# Patient Record
Sex: Male | Born: 2000 | Race: Black or African American | Hispanic: No | Marital: Single | Smoking: Never smoker
Health system: Southern US, Community
[De-identification: ages and names within clinical notes are randomized; demographics above are authoritative.]

## PROBLEM LIST (undated history)

## (undated) DIAGNOSIS — H539 Unspecified visual disturbance: Secondary | ICD-10-CM

## (undated) DIAGNOSIS — R51 Headache: Secondary | ICD-10-CM

## (undated) DIAGNOSIS — R519 Headache, unspecified: Secondary | ICD-10-CM

## (undated) DIAGNOSIS — M9511 Cauliflower ear, right ear: Secondary | ICD-10-CM

## (undated) DIAGNOSIS — T7840XA Allergy, unspecified, initial encounter: Secondary | ICD-10-CM

## (undated) HISTORY — PX: CIRCUMCISION: SUR203

---
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
## 2019-10-23 ENCOUNTER — Ambulatory Visit: Payer: BC Managed Care – PPO | Attending: Orthopedic Surgery | Admitting: Physical Therapy

## 2019-10-23 ENCOUNTER — Encounter: Payer: Self-pay | Admitting: Physical Therapy

## 2019-10-23 ENCOUNTER — Other Ambulatory Visit: Payer: Self-pay

## 2019-10-23 DIAGNOSIS — M25561 Pain in right knee: Secondary | ICD-10-CM | POA: Insufficient documentation

## 2019-10-23 DIAGNOSIS — R600 Localized edema: Secondary | ICD-10-CM | POA: Diagnosis present

## 2019-10-23 DIAGNOSIS — M25661 Stiffness of right knee, not elsewhere classified: Secondary | ICD-10-CM | POA: Diagnosis present

## 2019-10-23 DIAGNOSIS — R2689 Other abnormalities of gait and mobility: Secondary | ICD-10-CM | POA: Insufficient documentation

## 2019-10-23 NOTE — Addendum Note (Signed)
Addended by: Dessie Coma on: 10/23/2019 02:57 PM   Modules accepted: Orders

## 2019-10-23 NOTE — Therapy (Signed)
[facility] [address] [place], [place], [address] Phone: [phone]   Fax:  [phone]  Physical Therapy Evaluation  Patient Details  Name: Miguel Austin MRN: [id number] Date of Birth: 07-11-2001 Referring Provider (PT): Dr Midge Aver    Encounter Date: 10/23/2019  PT End of Session - 10/23/19 1244    Visit Number  1    Number of Visits  8    Date for PT Re-Evaluation  11/20/19    Authorization Type  BCBS per patient    PT Start Time  1015    PT Stop Time  1056    PT Time Calculation (min)  41 min    Activity Tolerance  Patient tolerated treatment well    Behavior During Therapy  [facility] for tasks assessed/performed       Past Medical History:  Diagnosis Date  . Allergy   . Cauliflower ear, right   . Headache   . Vision abnormalities    wears glasses    Past Surgical History:  Procedure Laterality Date  . CIRCUMCISION    . HEMATOMA EVACUATION Right 08/21/2018   Procedure: EVACUATION HEMATOMA;  Surgeon: Suzanna Obey, MD;  Location: [facility] OR;  Service: ENT;  Laterality: Right;    There were no vitals filed for this visit.   Subjective Assessment - 10/23/19 1021    Subjective  Patient had a right menisectomy on 10/09/2019 He was on crutches for 2 weeks and just came off yesterday. His knee is uncomforabtle but not significantly painful. He is a Proofreader.    How long can you walk comfortably?  becaomes tight after some time    Diagnostic tests  Nothing post-op    Patient Stated Goals  to return to wreslitng    Currently in Pain?  Yes    Pain Score  4     Pain Location  Knee    Pain Orientation  Right    Pain Descriptors / Indicators  Aching    Pain Type  Acute pain;Surgical pain    Pain Frequency  Constant    Aggravating Factors   bending the knee    Pain Relieving Factors  rest    Effect of Pain on Daily Activities  difficulty perfroming ADL's         [facility] PT Assessment - 10/23/19 0001      Assessment   Medical Diagnosis  Right Knee Menisectomy     Referring Provider (PT)  Dr Worthy Flank Spang     Hand Dominance  Right    Next MD Visit  Nothing Scheduled     Prior Therapy  None      Precautions   Precautions  None      Restrictions   Weight Bearing Restrictions  No      Balance Screen   Has the patient fallen in the past 6 months  No    Has the patient had a decrease in activity level because of a fear of falling?   No    Is the patient reluctant to leave their home because of a fear of falling?   No      Home Environment   Additional Comments  3rd floor dorm room s       Prior Function   Level of Independence  Independent    Vocation  Student    Leisure  wrestler       Cognition   Overall Cognitive Status  Within Functional Limits for tasks assessed  Attention  Focused    Focused Attention  Appears intact    Memory  Appears intact    Awareness  Appears intact    Problem Solving  Appears intact      Observation/Other Assessments   Observations  well healed arthrocopic portals     Focus on Therapeutic Outcomes (FOTO)   only seeing for the beggning of his treatment       Sensation   Light Touch  Appears Intact    Additional Comments  denies parathesias       Coordination   Gross Motor Movements are Fluid and Coordinated  Yes    Fine Motor Movements are Fluid and Coordinated  Yes      ROM / Strength   AROM / PROM / Strength  AROM;PROM;Strength      AROM   Overall AROM Comments  100 degrees of knee flexion; full extension but feels pressue       PROM   PROM Assessment Site  Knee    Right/Left Knee  Left;Right    Right Knee Flexion  110    Left Knee Flexion  135      Strength   Strength Assessment Site  Hip;Knee    Right/Left Hip  Left;Right    Right Hip Flexion  4+/5    Right Hip ABduction  5/5    Right Hip ADduction  5/5    Left Hip Flexion  5/5    Left Hip Extension  5/5    Left Hip ABduction  5/5    Left Hip ADduction  5/5    Right/Left  Knee  Left;Right    Right Knee Flexion  5/5    Right Knee Extension  4+/5    Left Knee Flexion  5/5    Left Knee Extension  5/5      Palpation   Palpation comment  no unexpected tenderness to palpation       Ambulation/Gait   Gait Comments  lateral movement off the right leg. Decreased single leg stance time.                 Objective measurements completed on examination: See above findings.      [facility] Adult PT Treatment/Exercise - 10/23/19 0001      Knee/Hip Exercises: Stretches   Active Hamstring Stretch Limitations  hamstring stretch with strap 3x20 sec hold     Other Knee/Hip Stretches  supine heel slide in pain free range 5x10 sec hold       Knee/Hip Exercises: Standing   Other Standing Knee Exercises  standing slow march x15       Knee/Hip Exercises: Supine   Straight Leg Raises Limitations  x20       Knee/Hip Exercises: Sidelying   Other Sidelying Knee/Hip Exercises  x20                PT Short Term Goals - 10/23/19 1250      PT SHORT TERM GOAL #1   Title  Patient will deonstrate full pain free flexion    Time  3    Period  Weeks    Status  New    Target Date  11/13/19      PT SHORT TERM GOAL #2   Title  Patient will demonstrate full pain free extension    Time  3    Period  Weeks    Status  New    Target Date  11/13/19      PT  SHORT TERM GOAL #3   Title  Patient will demonstrate 5/5 gross left LE strength    Time  3    Period  Weeks    Status  New    Target Date  11/13/19                Plan - 10/23/19 1244    Clinical Impression Statement  Patient is a 19 year old male S/P right medial meniscectomy on 10/09/2019. He presents with expected limitations in flexio, strength, and gait. He has minor swelling at this time. He will be seen for 2 weeks at our [facility] clinic before returning to school to finish his rehab. He is a TEFL teacher.    Personal Factors and Comorbidities  Other   college wrestler    Examination-Activity Limitations  Locomotion Level;Stairs;Squat    Examination-Participation Restrictions  Community Activity    Stability/Clinical Decision Making  Stable/Uncomplicated    Clinical Decision Making  Low    Rehab Potential  Excellent    PT Frequency  2x / week    PT Duration  4 weeks    PT Treatment/Interventions  ADLs/Self Care Home Management;Cryotherapy;Electrical Stimulation;Iontophoresis 4mg /ml Dexamethasone;Traction;Moist Heat;DME Instruction;Gait training;Stair training;Functional mobility training;Therapeutic activities;Therapeutic exercise;Neuromuscular re-education;Patient/family education;Passive range of motion;Taping    PT Next Visit Plan  start closed chain standing exercises if tolerated for ghome program. Abvid deep squatting; start single leg stance if patient has the quad control; review mat exercsies.Begin sep ups; work on ROM    PT Home Exercise Plan  SLR forward and sidelying;    Consulted and Agree with Plan of Care  Patient       Patient will benefit from skilled therapeutic intervention in order to improve the following deficits and impairments:  Abnormal gait, Decreased endurance, Difficulty walking, Pain, Decreased activity tolerance, Decreased range of motion  Visit Diagnosis: Acute pain of right knee  Stiffness of right knee, not elsewhere classified  Other abnormalities of gait and mobility  Localized edema     Problem List There are no problems to display for this patient.   Carney Living PT DPT  10/23/2019, 2:52 PM  [facility] [place] [facility] [address] [place], [place], [address] Phone: [phone]   Fax:  [phone]  Name: Miguel Austin MRN: [id number] Date of Birth: 2001-02-08

## 2019-10-25 ENCOUNTER — Ambulatory Visit: Payer: BC Managed Care – PPO | Admitting: Physical Therapy

## 2019-10-25 ENCOUNTER — Other Ambulatory Visit: Payer: Self-pay

## 2019-10-25 DIAGNOSIS — R2689 Other abnormalities of gait and mobility: Secondary | ICD-10-CM

## 2019-10-25 DIAGNOSIS — R600 Localized edema: Secondary | ICD-10-CM

## 2019-10-25 DIAGNOSIS — M25561 Pain in right knee: Secondary | ICD-10-CM

## 2019-10-25 DIAGNOSIS — M25661 Stiffness of right knee, not elsewhere classified: Secondary | ICD-10-CM

## 2019-10-26 NOTE — Therapy (Addendum)
[place], [place], [address] Phone: [phone]   Fax:  [phone]  Physical Therapy Treatment  Patient Details  Name: Miguel Austin MRN: [id number] Date of Birth: 03-04-2001 Referring Provider (PT): Dr Pincus Badder    Encounter Date: 10/25/2019  PT End of Session - 10/25/19 1641    Visit Number  2    Number of Visits  8    Date for PT Re-Evaluation  11/20/19    Authorization Type  BCBS per patient    PT Start Time  1610    PT Stop Time  1648    PT Time Calculation (min)  38 min    Activity Tolerance  Patient tolerated treatment well    Behavior During Therapy  [facility] for tasks assessed/performed       Past Medical History:  Diagnosis Date  . Allergy   . Cauliflower ear, right   . Headache   . Vision abnormalities    wears glasses    Past Surgical History:  Procedure Laterality Date  . CIRCUMCISION    . HEMATOMA EVACUATION Right 08/21/2018   Procedure: EVACUATION HEMATOMA;  Surgeon: Melissa Montane, MD;  Location: [place];  Service: ENT;  Laterality: Right;    There were no vitals filed for this visit.  Subjective Assessment - 10/26/19 1036    Subjective  Patient reports no pain. he feels the swelling has gone down    Currently in Pain?  No/denies                       [facility] Adult PT Treatment/Exercise - 10/26/19 0001      Lumbar Exercises: Machines for Strengthening   Other Lumbar Machine Exercise  leg press x20 60 lbs mild pressure reported       Knee/Hip Exercises: Stretches   Active Hamstring Stretch Limitations  hamstring stretch with strap 3x20 sec hold       Knee/Hip Exercises: Standing   Heel Raises Limitations  x20    Knee Flexion Limitations  Standing slow march x20 each leg     Functional Squat Limitations  x20 0-60     SLS  3x30 sec hold     Other Standing Knee Exercises  step onto air-ex and hold x20 forward and lateral     Other Standing Knee Exercises   lateral band walk blue x20       Knee/Hip Exercises: Supine   Short Arc Quad Sets Limitations  x30 4lb     Bridges Limitations  x30     Straight Leg Raises Limitations  x30 2lb       Knee/Hip Exercises: Sidelying   Other Sidelying Knee/Hip Exercises  x30 2lb       Knee/Hip Exercises: Prone   Other Prone Exercises  x30 2lb               PT Short Term Goals - 10/23/19 1250      PT SHORT TERM GOAL #1   Title  Patient will deonstrate full pain free flexion    Time  3    Period  Weeks    Status  New    Target Date  11/13/19      PT SHORT TERM GOAL #2   Title  Patient will demonstrate full pain free extension    Time  3    Period  Weeks    Status  New    Target Date  11/13/19  PT SHORT TERM GOAL #3   Title  Patient will demonstrate 5/5 gross left LE strength    Time  3    Period  Weeks    Status  New    Target Date  11/13/19               Plan - 10/26/19 1040    Clinical Impression Statement  Patient tolerated treatment well. He reported a minor feeling of swelling of treatment. he was advised to ice at home. We will either progress or scale back treatment depending on reaction to tratment.    Personal Factors and Comorbidities  Other    Examination-Activity Limitations  Locomotion Level;Stairs;Squat    Examination-Participation Restrictions  Community Activity    Stability/Clinical Decision Making  Stable/Uncomplicated    Clinical Decision Making  Low    Rehab Potential  Excellent    PT Frequency  2x / week    PT Duration  4 weeks    PT Treatment/Interventions  ADLs/Self Care Home Management;Cryotherapy;Electrical Stimulation;Iontophoresis 4mg /ml Dexamethasone;Traction;Moist Heat;DME Instruction;Gait training;Stair training;Functional mobility training;Therapeutic activities;Therapeutic exercise;Neuromuscular re-education;Patient/family education;Passive range of motion;Taping    PT Next Visit Plan  consider rebounder, step ups, cone drill and cable  walk if last week tolerated well    PT Home Exercise Plan  SLR forward and sidelying;    Consulted and Agree with Plan of Care  Patient       Patient will benefit from skilled therapeutic intervention in order to improve the following deficits and impairments:  Abnormal gait, Decreased endurance, Difficulty walking, Pain, Decreased activity tolerance, Decreased range of motion  Visit Diagnosis: Acute pain of right knee  Stiffness of right knee, not elsewhere classified  Other abnormalities of gait and mobility  Localized edema     Problem List There are no problems to display for this patient.   PT DPT  10/26/2019, 10:45 AM  [facility] [address] [place], [place], [place] Phone: [phone]   Fax:  [phone]  Name: Miguel Austin MRN: Donnella Sham Date of Birth: April 15, 2001

## 2019-11-01 ENCOUNTER — Encounter: Payer: Self-pay | Admitting: Physical Therapy

## 2019-11-01 ENCOUNTER — Other Ambulatory Visit: Payer: Self-pay

## 2019-11-01 ENCOUNTER — Ambulatory Visit: Payer: BC Managed Care – PPO | Admitting: Physical Therapy

## 2019-11-01 DIAGNOSIS — R600 Localized edema: Secondary | ICD-10-CM

## 2019-11-01 DIAGNOSIS — M25561 Pain in right knee: Secondary | ICD-10-CM

## 2019-11-01 DIAGNOSIS — M25661 Stiffness of right knee, not elsewhere classified: Secondary | ICD-10-CM

## 2019-11-01 DIAGNOSIS — R2689 Other abnormalities of gait and mobility: Secondary | ICD-10-CM

## 2019-11-01 NOTE — Therapy (Signed)
[place], [place], [address] Phone: [phone]   Fax:  [phone]  Physical Therapy Treatment  Patient Details  Name: Miguel Austin MRN: [id number] Date of Birth: 10/16/2001 Referring Provider (PT): Dr Pincus Badder    Encounter Date: 11/01/2019  PT End of Session - 11/01/19 0847    Visit Number  3    Number of Visits  8    Date for PT Re-Evaluation  11/20/19    Authorization Type  BCBS per patient    PT Start Time  0845    PT Stop Time  0923    PT Time Calculation (min)  38 min       Past Medical History:  Diagnosis Date  . Allergy   . Cauliflower ear, right   . Headache   . Vision abnormalities    wears glasses    Past Surgical History:  Procedure Laterality Date  . CIRCUMCISION    . HEMATOMA EVACUATION Right 08/21/2018   Procedure: EVACUATION HEMATOMA;  Surgeon: Melissa Montane, MD;  Location: [place];  Service: ENT;  Laterality: Right;    There were no vitals filed for this visit.                    [facility] Adult PT Treatment/Exercise - 11/01/19 0001      Knee/Hip Exercises: Stretches   Active Hamstring Stretch Limitations  hamstring stretch with strap 3x20 sec hold     Gastroc Stretch Limitations  slant board stretch     Other Knee/Hip Stretches  supine heel slide in pain free range 5x10 sec hold    130 degrees      Knee/Hip Exercises: Aerobic   Recumbent Bike  L2 x 5 minutes       Knee/Hip Exercises: Standing   Heel Raises Limitations  x20    Knee Flexion Limitations  Standing slow march x20 each leg    on air ex    Lateral Step Up  20 reps;Hand Hold: 0;Step Height: 6"    Forward Step Up  20 reps;Step Height: 6"    Functional Squat Limitations  x20 0-60    x 20    SLS  3x30 sec hold    on air ex    Other Standing Knee Exercises  cone drill from counter, then from 6 inch step, cues for alignment  and postur e      Knee/Hip Exercises: Supine   Short Arc Quad  Sets Limitations  x 30 4lbs    Bridges with Clamshell  3 sets;10 reps   blue band    Straight Leg Raises Limitations  x30 2lb       Knee/Hip Exercises: Sidelying   Other Sidelying Knee/Hip Exercises  x30 2lb       Knee/Hip Exercises: Prone   Other Prone Exercises  x30 2lb               PT Short Term Goals - 10/23/19 1250      PT SHORT TERM GOAL #1   Title  Patient will deonstrate full pain free flexion    Time  3    Period  Weeks    Status  New    Target Date  11/13/19      PT SHORT TERM GOAL #2   Title  Patient will demonstrate full pain free extension    Time  3    Period  Weeks    Status  New  Target Date  11/13/19      PT SHORT TERM GOAL #3   Title  Patient will demonstrate 5/5 gross left LE strength    Time  3    Period  Weeks    Status  New    Target Date  11/13/19               Plan - 11/01/19 0910    Clinical Impression Statement  Pt reports good response to last session and ready to progress. He does reports a "shift" in his knee that happens intermittently without specific trigger. Yesterday it happened when getting in the shower. Able to progress with step ups  and cone drills per PT POC. No c/o increased pain during session and no epsisodes of "shifting." Continued with supine hip and knee strengtening followed by stretching. He will ice at home.    PT Next Visit Plan  consider rebounder and cable walk if tolerated last session well, continuestep ups and cone drill as tolerated, monitor for "shift" in knee    PT Home Exercise Plan  SLR forward and sidelying;       Patient will benefit from skilled therapeutic intervention in order to improve the following deficits and impairments:  Abnormal gait, Decreased endurance, Difficulty walking, Pain, Decreased activity tolerance, Decreased range of motion  Visit Diagnosis: Acute pain of right knee  Stiffness of right knee, not elsewhere classified  Other abnormalities of gait and  mobility  Localized edema     Problem List There are no problems to display for this patient.   Miguel Austin , [place] 11/01/2019, 9:24 AM  [facility] [address] [place], [place], [address] Phone: [phone]   Fax:  [phone]  Name: Miguel Austin MRN: [id number] Date of Birth: 2001-06-05

## 2019-11-02 ENCOUNTER — Encounter: Payer: Self-pay | Admitting: Physical Therapy

## 2019-11-02 ENCOUNTER — Ambulatory Visit: Payer: BC Managed Care – PPO | Admitting: Physical Therapy

## 2019-11-02 DIAGNOSIS — R2689 Other abnormalities of gait and mobility: Secondary | ICD-10-CM

## 2019-11-02 DIAGNOSIS — M25661 Stiffness of right knee, not elsewhere classified: Secondary | ICD-10-CM

## 2019-11-02 DIAGNOSIS — M25561 Pain in right knee: Secondary | ICD-10-CM | POA: Diagnosis not present

## 2019-11-02 DIAGNOSIS — R600 Localized edema: Secondary | ICD-10-CM

## 2019-11-02 NOTE — Therapy (Signed)
[place], [place], [address] Phone: [phone]   Fax:  [phone]  Physical Therapy Treatment/Discharge   Patient Details  Name: Miguel Austin MRN: [id number] Date of Birth: 07/22/2001 Referring Provider (PT): Dr Pincus Badder    Encounter Date: 11/02/2019  PT End of Session - 11/02/19 1257    Visit Number  4    Number of Visits  8    Date for PT Re-Evaluation  11/20/19    Authorization Type  BCBS per patient    PT Start Time  1103    PT Stop Time  1143    PT Time Calculation (min)  40 min    Activity Tolerance  Patient tolerated treatment well    Behavior During Therapy  [facility] for tasks assessed/performed       Past Medical History:  Diagnosis Date  . Allergy   . Cauliflower ear, right   . Headache   . Vision abnormalities    wears glasses    Past Surgical History:  Procedure Laterality Date  . CIRCUMCISION    . HEMATOMA EVACUATION Right 08/21/2018   Procedure: EVACUATION HEMATOMA;  Surgeon: Melissa Montane, MD;  Location: [facility];  Service: ENT;  Laterality: Right;    There were no vitals filed for this visit.  Subjective Assessment - 11/02/19 1255    Subjective  No complaints today. Patient had no swelling after the last visit.    How long can you walk comfortably?  becaomes tight after some time    Diagnostic tests  Nothing post-op    Patient Stated Goals  to return to wreslitng    Currently in Pain?  No/denies                       [facility] Adult PT Treatment/Exercise - 11/02/19 0001      Knee/Hip Exercises: Stretches   Active Hamstring Stretch Limitations  hamstring stretch with strap 3x20 sec hold     Gastroc Stretch Limitations  slant board stretch     Other Knee/Hip Stretches  supine heel slide in pain free range 5x10 sec hold    130 degrees      Knee/Hip Exercises: Aerobic   Recumbent Bike  L2 x 5 minutes       Knee/Hip Exercises: Standing   Heel Raises  Limitations  x20    Knee Flexion Limitations  Standing slow march x20 each leg    on air ex    Lateral Step Up  20 reps;Hand Hold: 0;Step Height: 6"    Forward Step Up  20 reps;Step Height: 6"    Functional Squat Limitations  x20 0-60    x 20    SLS  3x30 sec hold    on air ex    Other Standing Knee Exercises  cone drill from counter, then from 6 inch step, cues for alignment  and postur e    Other Standing Knee Exercises  rebounder x20 yellow; kettle bell swings x20; squat with kettle bell x20; deadlift from 8 inch step 35lbs ; wresling stance with forward motion knee slightly over toe x20;       Knee/Hip Exercises: Supine   Bridges with Clamshell  3 sets;10 reps   blue band    Straight Leg Raises Limitations  x30 3lb       Knee/Hip Exercises: Sidelying   Other Sidelying Knee/Hip Exercises  x30 3lb       Knee/Hip Exercises: Prone  Other Prone Exercises  x30 2lb             PT Education - 11/02/19 1257    Education Details  progression of activity    Person(s) Educated  Patient    Methods  Demonstration;Tactile cues;Verbal cues;Explanation    Comprehension  Verbalized understanding;Returned demonstration;Verbal cues required;Tactile cues required       PT Short Term Goals - 10/23/19 1250      PT SHORT TERM GOAL #1   Title  Patient will deonstrate full pain free flexion    Time  3    Period  Weeks    Status  New    Target Date  11/13/19      PT SHORT TERM GOAL #2   Title  Patient will demonstrate full pain free extension    Time  3    Period  Weeks    Status  New    Target Date  11/13/19      PT SHORT TERM GOAL #3   Title  Patient will demonstrate 5/5 gross left LE strength    Time  3    Period  Weeks    Status  New    Target Date  11/13/19               Plan - 11/02/19 1257    Clinical Impression Statement  Patient will be returning to school next week. He has made great progress. Therapy has been progressing him to tolerance. He has been able  to do light weightted activity in low ranges. He still has not sqauted to 90 degrees but he can get to 60 degrees confortably and without pressure. In order to return to wrestling he will have to get into a full kneeling posiution sitting back pon his heels. He may be a few weeks from being able to get into that position with pressure. Therapy talked to him about progression back to wresling. He was put into a standing position today and perfromed forward weight shifting mimmicing a begginging shot position. He had no pain or pressure. He was given his exercises that he has been doing in order to progress with his trainer. Per symptoms I would say he is 2 weeks away from shadow drilling and 2-3 weeks away from drilling with a partner. Live wreslting will depend on his progression 2nd to the unpredicatble nature of the sport. Our clinic will D/C patient at this time.    Personal Factors and Comorbidities  Other    Examination-Activity Limitations  Locomotion Level;Stairs;Squat    Examination-Participation Restrictions  Community Activity    Stability/Clinical Decision Making  Stable/Uncomplicated    Clinical Decision Making  Low    Rehab Potential  Excellent    PT Frequency  2x / week    PT Duration  4 weeks    PT Treatment/Interventions  ADLs/Self Care Home Management;Cryotherapy;Electrical Stimulation;Iontophoresis 33m/ml Dexamethasone;Traction;Moist Heat;DME Instruction;Gait training;Stair training;Functional mobility training;Therapeutic activities;Therapeutic exercise;Neuromuscular re-education;Patient/family education;Passive range of motion;Taping       Patient will benefit from skilled therapeutic intervention in order to improve the following deficits and impairments:  Abnormal gait, Decreased endurance, Difficulty walking, Pain, Decreased activity tolerance, Decreased range of motion  Visit Diagnosis: Acute pain of right knee  Stiffness of right knee, not elsewhere classified  Other  abnormalities of gait and mobility  Localized edema    PHYSICAL THERAPY DISCHARGE SUMMARY  Visits from Start of Care: 4  Current functional level related to goals / functional outcomes: Improved  ability to exercise    Remaining deficits: Not back to sport    Education / Equipment: HEP   Plan: Patient agrees to discharge.  Patient goals were met. Patient is being discharged due to meeting the stated rehab goals.  ?????      Problem List There are no problems to display for this patient.   Carney Living 11/02/2019, 1:04 PM  [facility] [address] [place], [place], [address] Phone: [phone]   Fax:  [phone]  Name: Kellyn Mansfield MRN: [id number] Date of Birth: 11-20-2000

## 2020-09-23 DIAGNOSIS — R21 Rash and other nonspecific skin eruption: Secondary | ICD-10-CM | POA: Diagnosis not present

## 2020-09-26 DIAGNOSIS — R238 Other skin changes: Secondary | ICD-10-CM | POA: Diagnosis not present

## 2020-09-26 DIAGNOSIS — R21 Rash and other nonspecific skin eruption: Secondary | ICD-10-CM | POA: Diagnosis not present

## 2021-05-25 DIAGNOSIS — M25462 Effusion, left knee: Secondary | ICD-10-CM | POA: Diagnosis not present

## 2021-05-25 DIAGNOSIS — M7989 Other specified soft tissue disorders: Secondary | ICD-10-CM | POA: Diagnosis not present

## 2021-05-25 DIAGNOSIS — M25562 Pain in left knee: Secondary | ICD-10-CM | POA: Diagnosis not present

## 2021-05-25 DIAGNOSIS — M7042 Prepatellar bursitis, left knee: Secondary | ICD-10-CM | POA: Diagnosis not present

## 2021-06-04 DIAGNOSIS — M25462 Effusion, left knee: Secondary | ICD-10-CM | POA: Diagnosis not present

## 2021-06-04 DIAGNOSIS — M71162 Other infective bursitis, left knee: Secondary | ICD-10-CM | POA: Diagnosis not present

## 2021-06-04 DIAGNOSIS — M25562 Pain in left knee: Secondary | ICD-10-CM | POA: Diagnosis not present

## 2021-06-08 DIAGNOSIS — M25462 Effusion, left knee: Secondary | ICD-10-CM | POA: Diagnosis not present

## 2021-06-08 DIAGNOSIS — M71162 Other infective bursitis, left knee: Secondary | ICD-10-CM | POA: Diagnosis not present

## 2021-06-08 DIAGNOSIS — M25562 Pain in left knee: Secondary | ICD-10-CM | POA: Diagnosis not present

## 2021-06-17 DIAGNOSIS — M71162 Other infective bursitis, left knee: Secondary | ICD-10-CM | POA: Diagnosis not present

## 2021-06-25 DIAGNOSIS — M71162 Other infective bursitis, left knee: Secondary | ICD-10-CM | POA: Diagnosis not present

## 2021-06-25 DIAGNOSIS — M25462 Effusion, left knee: Secondary | ICD-10-CM | POA: Diagnosis not present

## 2021-06-25 DIAGNOSIS — M25562 Pain in left knee: Secondary | ICD-10-CM | POA: Diagnosis not present

## 2021-08-24 DIAGNOSIS — S8991XA Unspecified injury of right lower leg, initial encounter: Secondary | ICD-10-CM | POA: Diagnosis not present

## 2021-08-24 DIAGNOSIS — M25561 Pain in right knee: Secondary | ICD-10-CM | POA: Diagnosis not present

## 2021-08-24 DIAGNOSIS — M25461 Effusion, right knee: Secondary | ICD-10-CM | POA: Diagnosis not present

## 2021-08-26 DIAGNOSIS — M25561 Pain in right knee: Secondary | ICD-10-CM | POA: Diagnosis not present

## 2021-08-26 DIAGNOSIS — X58XXXD Exposure to other specified factors, subsequent encounter: Secondary | ICD-10-CM | POA: Diagnosis not present

## 2021-08-26 DIAGNOSIS — S83200D Bucket-handle tear of unspecified meniscus, current injury, right knee, subsequent encounter: Secondary | ICD-10-CM | POA: Diagnosis not present

## 2021-08-26 DIAGNOSIS — S83251A Bucket-handle tear of lateral meniscus, current injury, right knee, initial encounter: Secondary | ICD-10-CM | POA: Diagnosis not present

## 2021-08-26 DIAGNOSIS — M25461 Effusion, right knee: Secondary | ICD-10-CM | POA: Diagnosis not present

## 2021-08-31 DIAGNOSIS — X58XXXA Exposure to other specified factors, initial encounter: Secondary | ICD-10-CM | POA: Diagnosis not present

## 2021-08-31 DIAGNOSIS — M25561 Pain in right knee: Secondary | ICD-10-CM | POA: Diagnosis not present

## 2021-08-31 DIAGNOSIS — S83281A Other tear of lateral meniscus, current injury, right knee, initial encounter: Secondary | ICD-10-CM | POA: Diagnosis not present

## 2021-08-31 DIAGNOSIS — S83251A Bucket-handle tear of lateral meniscus, current injury, right knee, initial encounter: Secondary | ICD-10-CM | POA: Diagnosis not present

## 2021-08-31 DIAGNOSIS — S83211A Bucket-handle tear of medial meniscus, current injury, right knee, initial encounter: Secondary | ICD-10-CM | POA: Diagnosis not present

## 2021-10-05 DIAGNOSIS — R269 Unspecified abnormalities of gait and mobility: Secondary | ICD-10-CM | POA: Diagnosis not present

## 2021-10-05 DIAGNOSIS — S83251D Bucket-handle tear of lateral meniscus, current injury, right knee, subsequent encounter: Secondary | ICD-10-CM | POA: Diagnosis not present

## 2021-10-05 DIAGNOSIS — H538 Other visual disturbances: Secondary | ICD-10-CM | POA: Diagnosis not present

## 2021-10-05 DIAGNOSIS — M25661 Stiffness of right knee, not elsewhere classified: Secondary | ICD-10-CM | POA: Diagnosis not present

## 2021-10-05 DIAGNOSIS — M6281 Muscle weakness (generalized): Secondary | ICD-10-CM | POA: Diagnosis not present

## 2021-10-07 DIAGNOSIS — R269 Unspecified abnormalities of gait and mobility: Secondary | ICD-10-CM | POA: Diagnosis not present

## 2021-10-07 DIAGNOSIS — M6281 Muscle weakness (generalized): Secondary | ICD-10-CM | POA: Diagnosis not present

## 2021-10-07 DIAGNOSIS — S83251D Bucket-handle tear of lateral meniscus, current injury, right knee, subsequent encounter: Secondary | ICD-10-CM | POA: Diagnosis not present

## 2021-10-07 DIAGNOSIS — M25661 Stiffness of right knee, not elsewhere classified: Secondary | ICD-10-CM | POA: Diagnosis not present

## 2021-10-14 DIAGNOSIS — M6281 Muscle weakness (generalized): Secondary | ICD-10-CM | POA: Diagnosis not present

## 2021-10-14 DIAGNOSIS — M25661 Stiffness of right knee, not elsewhere classified: Secondary | ICD-10-CM | POA: Diagnosis not present

## 2021-10-14 DIAGNOSIS — R269 Unspecified abnormalities of gait and mobility: Secondary | ICD-10-CM | POA: Diagnosis not present

## 2021-10-14 DIAGNOSIS — S83251D Bucket-handle tear of lateral meniscus, current injury, right knee, subsequent encounter: Secondary | ICD-10-CM | POA: Diagnosis not present

## 2021-10-20 DIAGNOSIS — R269 Unspecified abnormalities of gait and mobility: Secondary | ICD-10-CM | POA: Diagnosis not present

## 2021-10-20 DIAGNOSIS — M6281 Muscle weakness (generalized): Secondary | ICD-10-CM | POA: Diagnosis not present

## 2021-10-20 DIAGNOSIS — M25661 Stiffness of right knee, not elsewhere classified: Secondary | ICD-10-CM | POA: Diagnosis not present

## 2021-10-20 DIAGNOSIS — S83251D Bucket-handle tear of lateral meniscus, current injury, right knee, subsequent encounter: Secondary | ICD-10-CM | POA: Diagnosis not present

## 2021-12-31 DIAGNOSIS — S83281A Other tear of lateral meniscus, current injury, right knee, initial encounter: Secondary | ICD-10-CM | POA: Diagnosis not present

## 2022-04-01 DIAGNOSIS — S83281A Other tear of lateral meniscus, current injury, right knee, initial encounter: Secondary | ICD-10-CM | POA: Diagnosis not present

## 2022-04-24 ENCOUNTER — Ambulatory Visit
Admission: EM | Admit: 2022-04-24 | Discharge: 2022-04-24 | Disposition: A | Discharge status: Home / Self Care | Payer: BC Managed Care – PPO

## 2022-04-24 DIAGNOSIS — R200 Anesthesia of skin: Secondary | ICD-10-CM | POA: Diagnosis not present

## 2022-04-24 DIAGNOSIS — R202 Paresthesia of skin: Secondary | ICD-10-CM | POA: Diagnosis not present

## 2022-04-24 NOTE — ED Triage Notes (Signed)
Pt presents with some intermittent numbness and tingling all over body more so on hands since waking up this morning.

## 2022-04-24 NOTE — Discharge Instructions (Signed)
Follow up and establish care with PCP Go to ER if symptoms recur Call or go to the ED if you have any new or worsening symptoms such as fever, worsening cough, shortness of breath, chest tightness, chest pain, turning blue, changes in mental status, etc..Marland Kitchen

## 2022-04-24 NOTE — ED Provider Notes (Signed)
[facility]   [id number] 04/24/22 Arrival Time: 1751  Chief Complaint  Patient presents with   Numbness   Tingling     SUBJECTIVE: History from: patient and family.  Miguel Austin is a 21 y.o. male who presented to the urgent care with a complaint of anterior percent tingling and numbness all over his body last night.  He stated tingling numbness were more located on his hand.  Denies any precipitating event.  He stated his symptoms started while laying down and his phone ringing under the pillow.  Denies recent travel.  Denies any aggravating factors.  Denies previous symptoms in the past.   Denies fever, chills, fatigue, sinus pain, rhinorrhea, sore throat, SOB, wheezing, chest pain, nausea, changes in bowel or bladder habits.     ROS: As per HPI.  All other pertinent ROS negative.      Past Medical History:  Diagnosis Date   Allergy    Cauliflower ear, right    Headache    Vision abnormalities    wears glasses   Past Surgical History:  Procedure Laterality Date   CIRCUMCISION     HEMATOMA EVACUATION Right 08/21/2018   Procedure: EVACUATION HEMATOMA;  Surgeon: Suzanna Obey, MD;  Location: [facility] OR;  Service: ENT;  Laterality: Right;   No Known Allergies No current facility-administered medications on file prior to encounter.   Current Outpatient Medications on File Prior to Encounter  Medication Sig Dispense Refill   cephALEXin (KEFLEX) 500 MG capsule Take 1 capsule (500 mg total) by mouth 3 (three) times daily. 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    Social Determinants of Health   Financial Resource Strain: Not on file  Food Insecurity: Not on file  Transportation Needs: Not on file  Physical Activity: Not on file  Stress: Not on file  Social Connections: Not on file  Intimate Partner Violence: Not on file   Family History  Problem Relation Age of Onset   Hypertension Mother    Cancer Mother    Hypertension Father    Hypertension Maternal Grandmother    Hypertension Maternal Grandfather     OBJECTIVE:  Vitals:   04/24/22 0858  BP: 127/71  Pulse: (!) 50  Resp: 18  Temp: 97.8 F (36.6 C)  TempSrc: Oral  SpO2: 96%     Physical Exam Vitals and nursing note reviewed.  Constitutional:      General: He is not in acute distress.    Appearance: Normal appearance. He is normal weight. He is not ill-appearing, toxic-appearing or diaphoretic.  Cardiovascular:     Rate and Rhythm: Normal rate and regular rhythm.     Pulses: Normal pulses.     Heart sounds: Normal heart sounds. No murmur heard.    No friction rub. No gallop.  Pulmonary:     Effort: Pulmonary effort is normal. No respiratory distress.     Breath sounds: Normal breath sounds. No stridor. No wheezing, rhonchi or rales.  Chest:     Chest wall: No tenderness.  Neurological:     Mental Status: He is alert and oriented to person, place, and time.     GCS: GCS eye subscore is 4. GCS verbal subscore is 5. GCS motor subscore is 6.     Cranial Nerves: Cranial nerves 2-12 are intact.     Sensory: Sensation is intact.     Motor: Motor function is intact.     Coordination: Coordination is intact.     Gait: Gait is intact.      LABS:  No results found for this or any previous visit (from the past 24 hour(s)).   ASSESSMENT & PLAN:  1. Numbness and tingling     No orders of the defined types were placed in this  encounter.  Discharge Instructions  Follow up and establish care with PCP Go to ER if symptoms recur Call or go to the ED if you have any new or worsening symptoms such as fever, worsening cough, shortness of breath, chest tightness, chest pain, turning blue, changes in mental status, etc...     Reviewed expectations re: course of current medical issues. Questions answered. Outlined signs and symptoms indicating need for more acute intervention. Patient verbalized understanding. After Visit Summary given.          Durward Parcel, FNP 04/24/22 [phone]

## 2022-05-12 ENCOUNTER — Encounter: Payer: Self-pay | Admitting: Physician Assistant

## 2022-05-12 ENCOUNTER — Ambulatory Visit: Payer: BC Managed Care – PPO | Admitting: Physician Assistant

## 2022-05-12 VITALS — BP 114/59 | HR 54 | Resp 18 | Ht 73.0 in | Wt 220.0 lb

## 2022-05-12 DIAGNOSIS — R202 Paresthesia of skin: Secondary | ICD-10-CM

## 2022-05-12 DIAGNOSIS — R200 Anesthesia of skin: Secondary | ICD-10-CM

## 2022-05-12 NOTE — Assessment & Plan Note (Addendum)
Per patient, about 3 episodes, has not experienced since 04/25/2022 CBC, CMP ordered  Will observe for now. Importance of hydration discussed.  Recommend patient proceed with establishing care at primary care

## 2022-05-12 NOTE — Patient Instructions (Signed)
I encourage you to continue staying well-hydrated.  We will call you with your lab results when they are available.  Please let us know if there is anything else we can do for you.  Roney Jaffe, PA-C Physician Assistant [facility] [email or web address]   Health Maintenance, Male Adopting a healthy lifestyle and getting preventive care are important in promoting health and wellness. Ask your health care provider about: The right schedule for you to have regular tests and exams. Things you can do on your own to prevent diseases and keep yourself healthy. What should I know about diet, weight, and exercise? Eat a healthy diet  Eat a diet that includes plenty of vegetables, fruits, low-fat dairy products, and lean protein. Do not eat a lot of foods that are high in solid fats, added sugars, or sodium. Maintain a healthy weight Body mass index (BMI) is a measurement that can be used to identify possible weight problems. It estimates body fat based on height and weight. Your health care provider can help determine your BMI and help you achieve or maintain a healthy weight. Get regular exercise Get regular exercise. This is one of the most important things you can do for your health. Most adults should: Exercise for at least 150 minutes each week. The exercise should increase your heart rate and make you sweat (moderate-intensity exercise). Do strengthening exercises at least twice a week. This is in addition to the moderate-intensity exercise. Spend less time sitting. Even light physical activity can be beneficial. Watch cholesterol and blood lipids Have your blood tested for lipids and cholesterol at 22 years of age, then have this test every 5 years. You may need to have your cholesterol levels checked more often if: Your lipid or cholesterol levels are high. You are older than 22 years of age. You are at high risk for heart  disease. What should I know about cancer screening? Many types of cancers can be detected early and may often be prevented. Depending on your health history and family history, you may need to have cancer screening at various ages. This may include screening for: Colorectal cancer. Prostate cancer. Skin cancer. Lung cancer. What should I know about heart disease, diabetes, and high blood pressure? Blood pressure and heart disease High blood pressure causes heart disease and increases the risk of stroke. This is more likely to develop in people who have high blood pressure readings or are overweight. Talk with your health care provider about your target blood pressure readings. Have your blood pressure checked: Every 3-5 years if you are 59-58 years of age. Every year if you are 28 years old or older. If you are between the ages of 63 and 38 and are a current or former smoker, ask your health care provider if you should have a one-time screening for abdominal aortic aneurysm (AAA). Diabetes Have regular diabetes screenings. This checks your fasting blood sugar level. Have the screening done: Once every three years after age 70 if you are at a normal weight and have a low risk for diabetes. More often and at a younger age if you are overweight or have a high risk for diabetes. What should I know about preventing infection? Hepatitis B If you have a higher risk for hepatitis B, you should be screened for this virus. Talk with your health care provider to find out if you are at risk for hepatitis B infection. Hepatitis C Blood testing is recommended for: Everyone born from [birth year]  through 1965. Anyone with known risk factors for hepatitis C. Sexually transmitted infections (STIs) You should be screened each year for STIs, including gonorrhea and chlamydia, if: You are sexually active and are younger than 22 years of age. You are older than 23 years of age and your health care provider tells you  that you are at risk for this type of infection. Your sexual activity has changed since you were last screened, and you are at increased risk for chlamydia or gonorrhea. Ask your health care provider if you are at risk. Ask your health care provider about whether you are at high risk for HIV. Your health care provider may recommend a prescription medicine to help prevent HIV infection. If you choose to take medicine to prevent HIV, you should first get tested for HIV. You should then be tested every 3 months for as long as you are taking the medicine. Follow these instructions at home: Alcohol use Do not drink alcohol if your health care provider tells you not to drink. If you drink alcohol: Limit how much you have to 0-2 drinks a day. Know how much alcohol is in your drink. In the [place] one drink equals one 12 oz bottle of beer (355 mL), one 5 oz glass of wine (148 mL), or one 1 oz glass of hard liquor (44 mL). Lifestyle Do not use any products that contain nicotine or tobacco. These products include cigarettes, chewing tobacco, and vaping devices, such as e-cigarettes. If you need help quitting, ask your health care provider. Do not use street drugs. Do not share needles. Ask your health care provider for help if you need support or information about quitting drugs. General instructions Schedule regular health, dental, and eye exams. Stay current with your vaccines. Tell your health care provider if: You often feel depressed. You have ever been abused or do not feel safe at home. Summary Adopting a healthy lifestyle and getting preventive care are important in promoting health and wellness. Follow your health care provider's instructions about healthy diet, exercising, and getting tested or screened for diseases. Follow your health care provider's instructions on monitoring your cholesterol and blood pressure. This information is not intended to replace advice given to you by your health  care provider. Make sure you discuss any questions you have with your health care provider. Document Revised: 02/23/2021 Document Reviewed: 02/23/2021 Elsevier Patient Education  [place].

## 2022-05-12 NOTE — Progress Notes (Unsigned)
New Patient Office Visit  Subjective    Patient ID: Miguel Austin, male    DOB: 2001/07/07  Age: 21 y.o. MRN: [id number]  CC:  Chief Complaint  Patient presents with   Tingling    Around 7/11 Chills and tingling from head to toe in the middle of the night. Urgent care told pt to get blood work done.    HPI Miguel Austin states that he was seen in urgent care on 04/24/22 with the sensation of tingling in his body the night prior. He states he woke up from sleep around 2 AM and had about 3 episodes of "tingling that moved down his body." Was able to walk around and move.  Denies focal deficit. States that he went to urgent care and had no abnormal findings so they instructed him to follow up with primary care. He denies any recent increase in stress/anxiousness. Denies fever, chest pain, shortness of breath, sweating or weight loss. No recent sick contacts  Has not had these sensations since 04/25/2022 per patient. 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 He tries to drink about a gallon of water a day now.  He does report a history of minor headaches, denies N/V/photophobia. Report he may have had a headache at the time of this, but is unsure.   He is currently in the process of finding a PCP.   Outpatient Encounter Medications as of 05/12/2022  Medication Sig   cephALEXin (KEFLEX) 500 MG capsule Take 1 capsule (500 mg total) by mouth 3 (three) times daily. (Patient not taking: Reported on 05/12/2022)   No facility-administered encounter medications on file as of 05/12/2022.    Past Medical History:  Diagnosis Date   Allergy    Cauliflower ear, right    Headache    Vision abnormalities    wears glasses    Past Surgical History:  Procedure Laterality Date   CIRCUMCISION     HEMATOMA EVACUATION Right 08/21/2018   Procedure: EVACUATION HEMATOMA;  Surgeon: Suzanna Obey, MD;  Location: [facility] OR;  Service: ENT;  Laterality: Right;     Family History  Problem Relation Age of Onset   Hypertension Mother    Cancer Mother    Hypertension Father    Hypertension Maternal Grandmother    Hypertension Maternal Grandfather     Social History   Socioeconomic History   Marital status: Single    Spouse name: Not on file   Number of children: Not on file   Years of education: Not on file   Highest education level: Not on file  Occupational History   Not on file  Tobacco Use   Smoking status: Never   Smokeless tobacco: Never  Vaping Use   Vaping Use: Never used  Substance and Sexual Activity   Alcohol use: No   Drug use: No   Sexual activity: Never  Other Topics Concern   Not on file  Social History Narrative   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    Social Determinants of Health   Financial Resource Strain: Not on file  Food Insecurity: Not on file  Transportation Needs: Not on file  Physical Activity: Not on file  Stress: Not on file  Social Connections: Not on file  Intimate Partner Violence: Not on file    Review of Systems  Constitutional: Negative.   HENT: Negative.    Eyes:  Negative.   Respiratory: Negative.    Cardiovascular: Negative.   Gastrointestinal: Negative.   Genitourinary: Negative.   Musculoskeletal: Negative.   Skin: Negative.   Neurological: Negative.   Endo/Heme/Allergies: Negative.   Psychiatric/Behavioral: Negative.          Objective    BP (!) 114/59 (BP Location: Left Arm, Patient Position: Sitting, Cuff Size: Normal)   Pulse (!) 54   Resp 18   Ht 6\' 1"  (1.854 m)   Wt 220 lb (99.8 kg)   SpO2 97%   BMI 29.03 kg/m   Physical Exam Constitutional:      Appearance: Normal appearance.  HENT:     Right Ear: External ear normal.     Left Ear: External ear normal.     Mouth/Throat:     Mouth: Mucous membranes are moist.     Pharynx: Oropharynx is clear.  Eyes:     Conjunctiva/sclera: Conjunctivae normal.  Cardiovascular:      Rate and Rhythm: Normal rate and regular rhythm.     Pulses: Normal pulses.     Heart sounds: Normal heart sounds.  Pulmonary:     Effort: Pulmonary effort is normal.     Breath sounds: Normal breath sounds.  Abdominal:     General: Abdomen is flat. Bowel sounds are normal.  Skin:    General: Skin is warm.     Capillary Refill: Capillary refill takes less than 2 seconds.  Neurological:     Mental Status: He is alert and oriented to person, place, and time.          Assessment & Plan:   Problem List Items Addressed This Visit       Other   Numbness and tingling sensation of skin - Primary    Per patient, about 3 episodes, has not experienced since 04/25/2022 CBC, CMP ordered  Will observe for now. Importance of hydration discussed.  Recommend patient proceed with establishing care at primary care       Relevant Orders   CBC with Differential/Platelet (Completed)   Comp. Metabolic Panel (12) (Completed)   Completed by 06/26/2022, PA student.  I agree with assessment and plan   I have reviewed the patient's medical history (PMH, PSH, Social History, Family History, Medications, and allergies) , and have been updated if relevant. I spent 20 minutes reviewing chart and  face to face time with patient.    Return in 4 months (on 09/07/2022) for To establish PCP, with Dr. 09/09/2022 at [facility].    [facility], PA-C

## 2022-05-13 ENCOUNTER — Encounter: Payer: Self-pay | Admitting: Physician Assistant

## 2022-05-13 LAB — CBC WITH DIFFERENTIAL/PLATELET
Basophils Absolute: 0 10*3/uL (ref 0.0–0.2)
Basos: 1 %
EOS (ABSOLUTE): 0.1 10*3/uL (ref 0.0–0.4)
Eos: 2 %
Hematocrit: 41.1 % (ref 37.5–51.0)
Hemoglobin: 13.2 g/dL (ref 13.0–17.7)
Immature Grans (Abs): 0 10*3/uL (ref 0.0–0.1)
Immature Granulocytes: 0 %
Lymphocytes Absolute: 2.1 10*3/uL (ref 0.7–3.1)
Lymphs: 35 %
MCH: 29.9 pg (ref 26.6–33.0)
MCHC: 32.1 g/dL (ref 31.5–35.7)
MCV: 93 fL (ref 79–97)
Monocytes Absolute: 0.5 10*3/uL (ref 0.1–0.9)
Monocytes: 8 %
Neutrophils Absolute: 3.3 10*3/uL (ref 1.4–7.0)
Neutrophils: 54 %
Platelets: 165 10*3/uL (ref 150–450)
RBC: 4.41 x10E6/uL (ref 4.14–5.80)
RDW: 12 % (ref 11.6–15.4)
WBC: 6.1 10*3/uL (ref 3.4–10.8)

## 2022-05-13 LAB — COMP. METABOLIC PANEL (12)
AST: 45 IU/L — ABNORMAL HIGH (ref 0–40)
Albumin/Globulin Ratio: 1.6 (ref 1.2–2.2)
Albumin: 4.5 g/dL (ref 4.3–5.2)
Alkaline Phosphatase: 139 IU/L — ABNORMAL HIGH (ref 44–121)
BUN/Creatinine Ratio: 23 — ABNORMAL HIGH (ref 9–20)
BUN: 32 mg/dL — ABNORMAL HIGH (ref 6–20)
Bilirubin Total: 0.5 mg/dL (ref 0.0–1.2)
Calcium: 9.5 mg/dL (ref 8.7–10.2)
Chloride: 104 mmol/L (ref 96–106)
Creatinine, Ser: 1.4 mg/dL — ABNORMAL HIGH (ref 0.76–1.27)
Globulin, Total: 2.8 g/dL (ref 1.5–4.5)
Glucose: 88 mg/dL (ref 70–99)
Potassium: 4.4 mmol/L (ref 3.5–5.2)
Sodium: 138 mmol/L (ref 134–144)
Total Protein: 7.3 g/dL (ref 6.0–8.5)
eGFR: 73 mL/min/{1.73_m2} (ref >59)

## 2022-05-13 NOTE — Progress Notes (Signed)
Agree with assessment and plan ° °Garion Wempe S. Mayers, PA-C °Physician Assistant °[facility] °[email or web address] ° °

## 2022-05-24 ENCOUNTER — Ambulatory Visit: Payer: Self-pay

## 2022-05-24 NOTE — Telephone Encounter (Signed)
     Chief Complaint: Tingling, numbness to arms. Comes and goes Symptoms: Above Frequency: Happened again last night per mother Pertinent Negatives: Patient denies  Disposition: [x] ED /[] Urgent Care (no appt availability in office) / [] Appointment(In office/virtual)/ []  [facility]/ [] Home Care/ [] Refused Recommended Disposition /[] [facility]/ []  Follow-up with PCP Additional Notes: Instructed to go to ED.  Reason for Disposition  Patient sounds very sick or weak to the triager  Answer Assessment - Initial Assessment Questions 1. SYMPTOM: "What is the main symptom you are concerned about?" (e.g., weakness, numbness)     Numbness, tingling both arms  2. ONSET: "When did this start?" (minutes, hours, days; while sleeping)     Happened again last night 3. LAST NORMAL: "When was the last time you (the patient) were normal (no symptoms)?"     Several weeks 4. PATTERN "Does this come and go, or has it been constant since it started?"  "Is it present now?"     Comes and goes 5. CARDIAC SYMPTOMS: "Have you had any of the following symptoms: chest pain, difficulty breathing, palpitations?"     No 6. NEUROLOGIC SYMPTOMS: "Have you had any of the following symptoms: headache, dizziness, vision loss, double vision, changes in speech, unsteady on your feet?"     Unsure 7. OTHER SYMPTOMS: "Do you have any other symptoms?"     No 8. PREGNANCY: "Is there any chance you are pregnant?" "When was your last menstrual period?"     N/a  Protocols used: Neurologic Deficit-A-AH

## 2022-05-25 ENCOUNTER — Other Ambulatory Visit: Payer: Self-pay

## 2022-05-25 ENCOUNTER — Emergency Department (HOSPITAL_COMMUNITY)
Admission: EM | Admit: 2022-05-25 | Discharge: 2022-05-25 | Disposition: A | Discharge status: Home / Self Care | Payer: BC Managed Care – PPO | Attending: Emergency Medicine | Admitting: Emergency Medicine

## 2022-05-25 ENCOUNTER — Emergency Department (HOSPITAL_COMMUNITY): Payer: BC Managed Care – PPO

## 2022-05-25 DIAGNOSIS — R001 Bradycardia, unspecified: Secondary | ICD-10-CM | POA: Insufficient documentation

## 2022-05-25 DIAGNOSIS — R519 Headache, unspecified: Secondary | ICD-10-CM | POA: Insufficient documentation

## 2022-05-25 DIAGNOSIS — Z79899 Other long term (current) drug therapy: Secondary | ICD-10-CM | POA: Diagnosis not present

## 2022-05-25 DIAGNOSIS — R202 Paresthesia of skin: Secondary | ICD-10-CM | POA: Diagnosis not present

## 2022-05-25 LAB — URINALYSIS, ROUTINE W REFLEX MICROSCOPIC
Bilirubin Urine: NEGATIVE
Glucose, UA: NEGATIVE mg/dL
Hgb urine dipstick: NEGATIVE
Ketones, ur: 20 mg/dL — AB
Nitrite: NEGATIVE
Protein, ur: NEGATIVE mg/dL
Specific Gravity, Urine: 1.026 (ref 1.005–1.030)
WBC, UA: >50 WBC/hpf — ABNORMAL HIGH (ref 0–5)
pH: 5 (ref 5.0–8.0)

## 2022-05-25 LAB — CBC WITH DIFFERENTIAL/PLATELET
Abs Immature Granulocytes: 0.01 10*3/uL (ref 0.00–0.07)
Basophils Absolute: 0 10*3/uL (ref 0.0–0.1)
Basophils Relative: 1 %
Eosinophils Absolute: 0.1 10*3/uL (ref 0.0–0.5)
Eosinophils Relative: 2 %
HCT: 42.1 % (ref 39.0–52.0)
Hemoglobin: 13.6 g/dL (ref 13.0–17.0)
Immature Granulocytes: 0 %
Lymphocytes Relative: 42 %
Lymphs Abs: 2.5 10*3/uL (ref 0.7–4.0)
MCH: 30 pg (ref 26.0–34.0)
MCHC: 32.3 g/dL (ref 30.0–36.0)
MCV: 92.9 fL (ref 80.0–100.0)
Monocytes Absolute: 0.4 10*3/uL (ref 0.1–1.0)
Monocytes Relative: 7 %
Neutro Abs: 2.9 10*3/uL (ref 1.7–7.7)
Neutrophils Relative %: 48 %
Platelets: 249 10*3/uL (ref 150–400)
RBC: 4.53 MIL/uL (ref 4.22–5.81)
RDW: 12.6 % (ref 11.5–15.5)
WBC: 5.9 10*3/uL (ref 4.0–10.5)
nRBC: 0 % (ref 0.0–0.2)

## 2022-05-25 LAB — COMPREHENSIVE METABOLIC PANEL
ALT: 27 U/L (ref 0–44)
AST: 45 U/L — ABNORMAL HIGH (ref 15–41)
Albumin: 3.8 g/dL (ref 3.5–5.0)
Alkaline Phosphatase: 115 U/L (ref 38–126)
Anion gap: 8 (ref 5–15)
BUN: 30 mg/dL — ABNORMAL HIGH (ref 6–20)
CO2: 23 mmol/L (ref 22–32)
Calcium: 9.2 mg/dL (ref 8.9–10.3)
Chloride: 109 mmol/L (ref 98–111)
Creatinine, Ser: 1.26 mg/dL — ABNORMAL HIGH (ref 0.61–1.24)
GFR, Estimated: >60 mL/min (ref >60)
Glucose, Bld: 79 mg/dL (ref 70–99)
Potassium: 4.7 mmol/L (ref 3.5–5.1)
Sodium: 140 mmol/L (ref 135–145)
Total Bilirubin: 0.9 mg/dL (ref 0.3–1.2)
Total Protein: 7.1 g/dL (ref 6.5–8.1)

## 2022-05-25 LAB — RAPID URINE DRUG SCREEN, HOSP PERFORMED
Amphetamines: NOT DETECTED
Barbiturates: NOT DETECTED
Benzodiazepines: NOT DETECTED
Cocaine: NOT DETECTED
Opiates: NOT DETECTED
Tetrahydrocannabinol: NOT DETECTED

## 2022-05-25 LAB — POTASSIUM: Potassium: 4.7 mmol/L (ref 3.5–5.1)

## 2022-05-25 LAB — MAGNESIUM: Magnesium: 1.7 mg/dL (ref 1.7–2.4)

## 2022-05-25 MED ORDER — LACTATED RINGERS IV BOLUS
1000.0000 mL | Freq: Once | INTRAVENOUS | Status: AC
  Administered 2022-05-25: 1000 mL via INTRAVENOUS

## 2022-05-25 NOTE — ED Notes (Addendum)
EKG done and given to Dr.Goldston. MD notified that pt HR was ranging from 32bpm lowest to 50s highest. Pt is here for generalized tingling to all extremities. Seen at UC last week with no relief. Alert and oriented x 4. Ambulatory from triage. Cardiac monitoring in place.

## 2022-05-25 NOTE — ED Provider Notes (Signed)
[facility] EMERGENCY DEPARTMENT Provider Note   CSN: [id number] Arrival date & time: 05/25/22  0439     History  Chief Complaint  Patient presents with   Tingling    Miguel Austin is a 21 y.o. male.  HPI 21 year old male with a history of headaches, allergies, cauliflower ear presents to the ER with complaints of tingling throughout his entire body.  He has had several episodes like this in the past, including one about a month ago on 7/8 for which he went to urgent care for.  Urgent care did blood work and stated that he was little dehydrated but was overall healthy.  He states that this morning he woke up again and started to have whole body tingling.  He has no associated headache, blurry vision, nausea, vomiting, dizziness, syncope or near syncope, chest pain, shortness of breath, weakness.  He is otherwise in his normal state of health.  He states that he gets occasional headaches but they are not associated with a tingling and they are "not that bad".  He denies any fevers or chills.  He states the symptoms last about a day and then subside on their own.  He was told at urgent care that this could be a sign of a "mini stroke" and to go to the ER if this happens again.  He denies any symptoms at present.  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  The last episode started after he got home from the gym.  He denies any significant stressors or anxiety.    Home Medications Prior to Admission medications   Not on File      Allergies    Patient has no known allergies.    Review of Systems   Review of Systems Ten systems reviewed and are negative for acute change, except as noted in the HPI.   Physical Exam Updated Vital Signs BP 115/60   Pulse (!) 39   Temp 98 F (36.7 C) (Oral)   Resp 11   SpO2 99%  Physical Exam Vitals and nursing note reviewed.  Constitutional:      General: He is not in acute  distress.    Appearance: He is well-developed.  HENT:     Head: Normocephalic and atraumatic.  Eyes:     Conjunctiva/sclera: Conjunctivae normal.  Cardiovascular:     Rate and Rhythm: Normal rate and regular rhythm.     Heart sounds: No murmur heard. Pulmonary:     Effort: Pulmonary effort is normal. No respiratory distress.     Breath sounds: Normal breath sounds.  Abdominal:     Palpations: Abdomen is soft.     Tenderness: There is no abdominal tenderness.  Musculoskeletal:        General: No swelling.     Cervical back: Neck supple.  Skin:    General: Skin is warm and dry.     Capillary Refill: Capillary refill takes less than 2 seconds.  Neurological:     Mental Status: He is alert.     Comments: Mental Status:  Alert, thought content appropriate, able to give a coherent history. Speech fluent without evidence of aphasia. Able to follow 2 step commands without difficulty.  Cranial Nerves:  II:  Peripheral visual fields grossly normal, pupils equal, round, reactive to light III,IV, VI: ptosis not present, extra-ocular motions intact bilaterally  V,VII: smile symmetric, facial light touch sensation equal VIII:  hearing grossly normal to voice  X: uvula elevates symmetrically  XI: bilateral shoulder shrug symmetric and strong XII: midline tongue extension without fassiculations Motor:  Normal tone. 5/5 strength of BUE and BLE major muscle groups including strong and equal grip strength and dorsiflexion/plantar flexion Sensory: light touch normal in all extremities. Cerebellar: normal finger-to-nose with bilateral upper extremities, Romberg sign absent Gait: Not accessed     Psychiatric:        Mood and Affect: Mood normal.     ED Results / Procedures / Treatments   Labs (all labs ordered are listed, but only abnormal results are displayed) Labs Reviewed  COMPREHENSIVE METABOLIC PANEL - Abnormal; Notable for the following components:      Result Value   BUN 30 (*)     Creatinine, Ser 1.26 (*)    AST 45 (*)    All other components within normal limits  URINALYSIS, ROUTINE W REFLEX MICROSCOPIC - Abnormal; Notable for the following components:   APPearance HAZY (*)    Ketones, ur 20 (*)    Leukocytes,Ua LARGE (*)    WBC, UA >50 (*)    Bacteria, UA RARE (*)    All other components within normal limits  CBC WITH DIFFERENTIAL/PLATELET  RAPID URINE DRUG SCREEN, HOSP PERFORMED  POTASSIUM  MAGNESIUM    EKG EKG Interpretation  Date/Time:  Tuesday May 25 2022 09:04:33 EDT Ventricular Rate:  41 PR Interval:  180 QRS Duration: 94 QT Interval:  467 QTC Calculation: 386 R Axis:   61 Text Interpretation: Slow sinus arrhythmia Consider left atrial enlargement No old tracing to compare Confirmed by Pricilla Loveless [phone]) on 05/25/2022 10:10:04 AM  Radiology CT Head Wo Contrast  Result Date: 05/25/2022 CLINICAL DATA:  Headaches EXAM: CT HEAD WITHOUT CONTRAST TECHNIQUE: Contiguous axial images were obtained from the base of the skull through the vertex without intravenous contrast. RADIATION DOSE REDUCTION: This exam was performed according to the departmental dose-optimization program which includes automated exposure control, adjustment of the mA and/or kV according to patient size and/or use of iterative reconstruction technique. COMPARISON:  07/28/2006 FINDINGS: Brain: No acute intracranial findings are seen. There are no signs of bleeding within the cranium. Ventricles are not dilated. There are no epidural or subdural fluid collections. There is no focal edema or mass effect. Vascular: Unremarkable. Skull: Unremarkable. Sinuses/Orbits: Unremarkable. Other: None. IMPRESSION: No acute intracranial findings are seen in noncontrast CT brain. Electronically Signed   By: Ernie Avena M.D.   On: 05/25/2022 09:41    Procedures Procedures    Medications Ordered in ED Medications  lactated ringers bolus 1,000 mL (1,000 mLs Intravenous New Bag/Given  05/25/22 4132)    ED Course/ Medical Decision Making/ A&P                            Medical Decision Making Amount and/or Complexity of Data Reviewed Labs: ordered. Radiology: ordered.  21 year old male presenting with whole body tingling.  On arrival, he is normotensive, afebrile, heart rate in the upper 40s/low 50s with occasional drop into the mid 30s.  I reviewed his EKG, evidence of slow sinus arrhythmia.  He is otherwise asymptomatic.  His differential diagnosis includes electrolyte abnormalities, complicated migraine, less likely stroke, radiculopathy however less likely given it is "whole body", I have low suspicion that the bradycardia is contributing to his symptoms.  I personally reviewed his lab work, his CMP shows an elevated creatinine 1.26 which is slightly better  than his previous 1, though likely consistent with dehydration.  AST is elevated however there was hemolysis with a sample and suspect this is elevated secondary to this.  CBC is unremarkable.  UA shows large leukocytes, rare bacteria and more than 50 WBCs however he is asymptomatic.  His UDS is negative.  He was given 1 L IV fluids.  His mom at bedside expressed concern about a mini stroke discussed at urgent care, explained that my suspicion for this is low given his overall reassuring exam, however CT of the head ordered to rule out possible intracranial mass or any other abnormality.  I personally reviewed and interpreted his imaging, agree with radiology read.  CT head without any evidence of acute intracranial abnormality.  Overall work-up is reassuring however unclear cause of the patient's symptoms.  No significant electrolyte abnormalities noted.  I will refer to cardiology for his bradycardia and refer to neurology for the tingling.  We discussed return precautions.  He voiced her standing and is agreeable.  Stable for discharge.  Case discussed w/ Dr. Criss Alvine who is agreable to the above plan and disposition  Final  Clinical Impression(s) / ED Diagnoses Final diagnoses:  Tingling  Bradycardia    Rx / DC Orders ED Discharge Orders     None         Mare Ferrari, PA-C 05/25/22 1036    Pricilla Loveless, MD 05/27/22 [phone]

## 2022-05-25 NOTE — ED Provider Triage Note (Signed)
Emergency Medicine Provider Triage Evaluation Note  Miguel Austin , a 21 y.o. male  was evaluated in triage.  Pt complains of tingling from head to toe.  Patient states this has been ongoing for the past few weeks.  Notes he had a similar episode last night.  States tonight he was having a difficult time sleeping, woke up and noted a single episode of head to toe tingling that has since resolved.  Seen in urgent care for same without diagnosis made.  Denies significant past medical history, recent travel, injury, fevers or illness.  Denies changes in speech, vision, gait.  Review of Systems  Positive: Tingling Negative: As above  Physical Exam  BP 131/84 (BP Location: Right Arm)   Pulse (!) 48   Temp 98 F (36.7 C) (Oral)   Resp 12   SpO2 99%  Gen:   Awake, no distress   Resp:  Normal effort  MSK:   Moves extremities without difficulty  Other:    Medical Decision Making  Medically screening exam initiated at 5:06 AM.  Appropriate orders placed.  Miguel Austin was informed that the remainder of the evaluation will be completed by another provider, this initial triage assessment does not replace that evaluation, and the importance of remaining in the ED until their evaluation is complete.     Jeannie Fend, PA-C 05/25/22 [phone]

## 2022-05-25 NOTE — ED Notes (Signed)
Got patient undressed into a gown on the monitor patient is resting with family at beside and call bell in reach

## 2022-05-25 NOTE — ED Triage Notes (Signed)
Pt endorses  head to toe tingling sensation that worsened this AM. Pt reports that this has been going on x 3 weeks and was recently seen by UC for lab work that was WNL.

## 2022-05-25 NOTE — Discharge Instructions (Addendum)
You were evaluated in the Emergency Department and after careful evaluation, we did not find any emergent condition requiring admission or further testing in the hospital.  Your work-up today was overall reassuring.  Lab work showed that you might be a bit dehydrated.  Your CT scan of the head was overall reassuring.  Please follow-up with neurology for your symptoms.  I have also referred you to to cardiology for your slow heart rate however there is no indication for [place] to admit you to the hospital for this given you were some asymptomatic.  I do not think that your heart rate is contributing to the tingling.  Please return to the Emergency Department if you experience any worsening of your condition.Thank you for allowing [place] to be a part of your care.

## 2022-06-03 DIAGNOSIS — R001 Bradycardia, unspecified: Secondary | ICD-10-CM | POA: Diagnosis not present

## 2022-06-03 DIAGNOSIS — R202 Paresthesia of skin: Secondary | ICD-10-CM | POA: Diagnosis not present

## 2022-06-03 DIAGNOSIS — R42 Dizziness and giddiness: Secondary | ICD-10-CM | POA: Diagnosis not present

## 2022-06-03 DIAGNOSIS — R8290 Unspecified abnormal findings in urine: Secondary | ICD-10-CM | POA: Diagnosis not present

## 2022-06-15 ENCOUNTER — Encounter: Payer: Self-pay | Admitting: Diagnostic Neuroimaging

## 2022-06-15 ENCOUNTER — Ambulatory Visit: Payer: BC Managed Care – PPO | Attending: Cardiovascular Disease | Admitting: Cardiovascular Disease

## 2022-06-15 ENCOUNTER — Ambulatory Visit (INDEPENDENT_AMBULATORY_CARE_PROVIDER_SITE_OTHER): Payer: BC Managed Care – PPO | Admitting: Diagnostic Neuroimaging

## 2022-06-15 ENCOUNTER — Encounter: Payer: Self-pay | Admitting: Cardiovascular Disease

## 2022-06-15 VITALS — BP 108/68 | HR 47 | Ht 73.0 in | Wt 213.0 lb

## 2022-06-15 DIAGNOSIS — R4689 Other symptoms and signs involving appearance and behavior: Secondary | ICD-10-CM | POA: Diagnosis not present

## 2022-06-15 DIAGNOSIS — R001 Bradycardia, unspecified: Secondary | ICD-10-CM | POA: Diagnosis not present

## 2022-06-15 NOTE — Patient Instructions (Signed)
Medication Instructions:  Your physician recommends that you continue on your current medications as directed. Please refer to the Current Medication list given to you today.  *If you need a refill on your cardiac medications before your next appointment, please call your pharmacy*   Testing/Procedures:  ZIO XT- Long Term Monitor Instructions  Your physician has requested you wear a ZIO patch monitor for 7 days.  This is a single patch monitor. [employer] supplies one patch monitor per enrollment. Additional stickers are not available. Please do not apply patch if you will be having a Nuclear Stress Test,  Echocardiogram, Cardiac CT, MRI, or Chest Xray during the period you would be wearing the  monitor. The patch cannot be worn during these tests. You cannot remove and re-apply the  ZIO XT patch monitor.  Your ZIO patch monitor will be mailed 3 day USPS to your address on file. It may take 3-5 days  to receive your monitor after you have been enrolled.  Once you have received your monitor, please review the enclosed instructions. Your monitor  has already been registered assigning a specific monitor serial # to you.  Billing and Patient Assistance Program Information  We have supplied [employer] with any of your insurance information on file for billing purposes. [employer] offers a sliding scale Patient Assistance Program for patients that do not have  insurance, or whose insurance does not completely cover the cost of the ZIO monitor.  You must apply for the Patient Assistance Program to qualify for this discounted rate.  To apply, please call [employer] at [phone], select option 4, select option 2, ask to apply for  Patient Assistance Program. Meredeth Ide will ask your household income, and how many people  are in your household. They will quote your out-of-pocket cost based on that information.  [employer] will also be able to set up a 72-month, interest-free payment plan if needed.  Applying  the monitor   Shave hair from upper left chest.  Hold abrader disc by orange tab. Rub abrader in 40 strokes over the upper left chest as  indicated in your monitor instructions.  Clean area with 4 enclosed alcohol pads. Let dry.  Apply patch as indicated in monitor instructions. Patch will be placed under collarbone on left  side of chest with arrow pointing upward.  Rub patch adhesive wings for 2 minutes. Remove white label marked "1". Remove the white  label marked "2". Rub patch adhesive wings for 2 additional minutes.  While looking in a mirror, press and release button in center of patch. A small green light will  flash 3-4 times. This will be your only indicator that the monitor has been turned on.  Do not shower for the first 24 hours. You may shower after the first 24 hours.  Press the button if you feel a symptom. You will hear a small click. Record Date, Time and  Symptom in the Patient Logbook.  When you are ready to remove the patch, follow instructions on the last 2 pages of Patient  Logbook. Stick patch monitor onto the last page of Patient Logbook.  Place Patient Logbook in the blue and white box. Use locking tab on box and tape box closed  securely. The blue and white box has prepaid postage on it. Please place it in the mailbox as  soon as possible. Your physician should have your test results approximately 7 days after the  monitor has been mailed back to [facility].  Call [employer]  [facility] at [phone] if you have questions regarding  your ZIO XT patch monitor. Call them immediately if you see an orange light blinking on your  monitor.  If your monitor falls off in less than 4 days, contact our Monitor department at [phone].  If your monitor becomes loose or falls off after 4 days call [employer] at [phone] for  suggestions on securing your monitor  Your physician has requested that you have an echocardiogram. Echocardiography is a painless  test that uses sound waves to create images of your heart. It provides your doctor with information about the size and shape of your heart and how well your heart's chambers and valves are working. This procedure takes approximately one hour. There are no restrictions for this procedure. This procedure will be done at [facility], 2nd Floor    Follow-Up: At [facility], you and your health needs are our priority.  As part of our continuing mission to provide you with exceptional heart care, we have created designated Provider Care Teams.  These Care Teams include your primary Cardiologist (physician) and Advanced Practice Providers (APPs -  Physician Assistants and Nurse Practitioners) who all work together to provide you with the care you need, when you need it.  We recommend signing up for the patient portal called "MyChart".  Sign up information is provided on this After Visit Summary.  MyChart is used to connect with patients for Virtual Visits (Telemedicine).  Patients are able to view lab/test results, encounter notes, upcoming appointments, etc.  Non-urgent messages can be sent to your provider as well.   To learn more about what you can do with MyChart, go to [email or web address].    Your next appointment:   We will see you on an as needed basis.  Provider:   Nanetta Batty, MD

## 2022-06-15 NOTE — Assessment & Plan Note (Signed)
Miguel Austin was referred for bradycardia.  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  Is never had syncope or presyncope.  I do not think that this is problematic but is probably physiologic given the fact that he is an athlete.  I am going to get a 1 week Zio patch and a 2D echo to further evaluate.

## 2022-06-15 NOTE — Progress Notes (Unsigned)
[facility]  PATIENT: Miguel Austin DOB: [birth year]  REFERRING CLINICIAN: No ref. provider found HISTORY FROM: patient  REASON FOR VISIT: new consult    HISTORICAL  CHIEF COMPLAINT:  Chief Complaint  Patient presents with   New Patient (Initial Visit)    Pt reports feeling okay. He states that he felt the tingling sensation throughout his body about 3 times. He started noticing it back in July 2023. He reports it comes in waves. He only feels the sensation when going to sleep or laying down and the sensation last for 5 minutes. Room 7 with mom    HISTORY OF PRESENT ILLNESS:   21 year old male here for evaluation of numbness and tingling.  Patient has had 3 episodes lasting for 5 minutes each of whole body tingling.  These typically occurred when he would wake up from sleep in the middle the night.  No other associated symptoms.  No specific triggering or aggravating factors.  Patient went to the emergency room for evaluation and work-up was unremarkable.  Was noted to have some bradycardia, followed up with cardiology, felt to be related to endurance training.  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   REVIEW OF SYSTEMS: Full 14 system review of systems performed and negative with exception of: as per HPI.  ALLERGIES: No Known Allergies  HOME MEDICATIONS: Outpatient Medications Prior to Visit  Medication Sig Dispense Refill   Multiple Vitamin (MULTIVITAMIN) tablet Take 1 tablet by mouth daily.     No facility-administered medications prior to visit.    PAST MEDICAL HISTORY: Past Medical History:  Diagnosis Date   Allergy    Cauliflower ear, right    Headache    Vision abnormalities    wears glasses    PAST SURGICAL HISTORY: Past Surgical History:  Procedure Laterality Date   CIRCUMCISION     HEMATOMA EVACUATION Right 08/21/2018   Procedure:  EVACUATION HEMATOMA;  Surgeon: Suzanna Obey, MD;  Location: [facility] OR;  Service: ENT;  Laterality: Right;    FAMILY HISTORY: Family History  Problem Relation Age of Onset   Hypertension Mother    Cancer Mother    Hypertension Father    Hypertension Maternal Grandmother    Hypertension Maternal Grandfather     SOCIAL HISTORY: Social History   Socioeconomic History   Marital status: Single    Spouse name: Not on file   Number of children: Not on file   Years of education: Not on file   Highest education level: Not on file  Occupational History   Not on file  Tobacco Use   Smoking status: Never   Smokeless tobacco: Never  Vaping Use   Vaping Use: Never used  Substance and Sexual Activity   Alcohol use: No   Drug use: No   Sexual activity: Never  Other Topics Concern   Not on file  Social History Narrative   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    Social Determinants of Health   Financial Resource Strain: Not on file  Food Insecurity: Not on file  Transportation Needs: Not on file  Physical Activity: Not on file  Stress: Not on file  Social Connections: Not on file  Intimate Partner Violence: Not on file     PHYSICAL EXAM  GENERAL EXAM/CONSTITUTIONAL: Vitals:  Vitals:   06/15/22 1438  BP: 108/68  Pulse: Marland Kitchen)  47  Weight: 213 lb (96.6 kg)  Height: 6\' 1"  (1.854 m)   Body mass index is 28.1 kg/m. Wt Readings from Last 3 Encounters:  06/15/22 213 lb (96.6 kg)  06/15/22 213 lb 12.8 oz (97 kg)  05/12/22 220 lb (99.8 kg)   Patient is in no distress; well developed, nourished and groomed; neck is supple  CARDIOVASCULAR: Examination of carotid arteries is normal; no carotid bruits Regular rate and rhythm, no murmurs Examination of peripheral vascular system by observation and palpation is normal  EYES: Ophthalmoscopic exam of optic discs and posterior segments is normal; no papilledema or hemorrhages No results  found.  MUSCULOSKELETAL: Gait, strength, tone, movements noted in Neurologic exam below  NEUROLOGIC: MENTAL STATUS:      No data to display         awake, alert, oriented to person, place and time recent and remote memory intact normal attention and concentration language fluent, comprehension intact, naming intact fund of knowledge appropriate  CRANIAL NERVE:  2nd - no papilledema on fundoscopic exam 2nd, 3rd, 4th, 6th - pupils equal and reactive to light, visual fields full to confrontation, extraocular muscles intact, no nystagmus 5th - facial sensation symmetric 7th - facial strength symmetric 8th - hearing intact 9th - palate elevates symmetrically, uvula midline 11th - shoulder shrug symmetric 12th - tongue protrusion midline  MOTOR:  normal bulk and tone, full strength in the BUE, BLE  SENSORY:  normal and symmetric to light touch, temperature, vibration  COORDINATION:  finger-nose-finger, fine finger movements normal  REFLEXES:  deep tendon reflexes 1+ and symmetric  GAIT/STATION:  narrow based gait     DIAGNOSTIC DATA (LABS, IMAGING, TESTING) - I reviewed patient records, labs, notes, testing and imaging myself where available.  Lab Results  Component Value Date   WBC 5.9 05/25/2022   HGB 13.6 05/25/2022   HCT 42.1 05/25/2022   MCV 92.9 05/25/2022   PLT 249 05/25/2022      Component Value Date/Time   NA 140 05/25/2022 0519   NA 138 05/12/2022 1623   K 4.7 05/25/2022 0947   CL 109 05/25/2022 0519   CO2 23 05/25/2022 0519   GLUCOSE 79 05/25/2022 0519   BUN 30 (H) 05/25/2022 0519   BUN 32 (H) 05/12/2022 1623   CREATININE 1.26 (H) 05/25/2022 0519   CALCIUM 9.2 05/25/2022 0519   PROT 7.1 05/25/2022 0519   PROT 7.3 05/12/2022 1623   ALBUMIN 3.8 05/25/2022 0519   ALBUMIN 4.5 05/12/2022 1623   AST 45 (H) 05/25/2022 0519   ALT 27 05/25/2022 0519   ALKPHOS 115 05/25/2022 0519   BILITOT 0.9 05/25/2022 0519   BILITOT 0.5 05/12/2022 1623    GFRNONAA >60 05/25/2022 0519   No results found for: "CHOL", "HDL", "LDLCALC", "LDLDIRECT", "TRIG", "CHOLHDL" No results found for: "HGBA1C" No results found for: "VITAMINB12" No results found for: "TSH"  05/25/22 CT head  - No acute intracranial findings are seen in noncontrast CT brain.   ASSESSMENT AND PLAN  21 y.o. year old male here with:   Dx:  1. Spell of abnormal behavior     PLAN:  TRANSIENT NUMBNESS (whole body; likely benign paresthesias) - monitor symptoms; if returns, then check MRI brain and EEG  Return for pending if symptoms worsen or fail to improve, return to PCP.    35, MD 06/15/2022, 3:08 PM Certified in Neurology, Neurophysiology and Neuroimaging  [facility] [address], [address] [place], [place] [phone]

## 2022-06-15 NOTE — Progress Notes (Signed)
Miguel Austin   06/15/2022 Miguel Austin   2000-12-18  [id number]  Primary Physician Pcp, No Primary Cardiologist: Runell Gess MD Roseanne Reno  HPI:  Miguel Austin is a 21 y.o. fit and muscular appearing single African-American male who is accompanied by his mother Miguel Austin.  He was referred by the emergency room because of bradycardia.  He currently is a Holiday representative at [employer].  He is studying exercise science and sports psychology.  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  He has no cardiac risk factors.  He denies chest pain or shortness of breath.  He recently had an ER evaluation for feeling numb all over and his heart rate was noted to be in the 40s.  He is referred here for bradycardia.   Current Meds  Medication Sig   Multiple Vitamin (MULTIVITAMIN) tablet Take 1 tablet by mouth daily.     No Known Allergies  Social History   Socioeconomic History   Marital status: Single    Spouse name: Not on file   Number of children: Not on file   Years of education: Not on file   Highest education level: Not on file  Occupational History   Not on file  Tobacco Use   Smoking status: Never   Smokeless tobacco: Never  Vaping Use   Vaping Use: Never used  Substance and Sexual Activity   Alcohol use: No   Drug use: No   Sexual activity: Never  Other Topics Concern   Not on file  Social History Narrative   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    Social Determinants of Health   Financial Resource Strain: Not on file  Food Insecurity: Not on file  Transportation Needs: Not on file  Physical Activity: Not on file  Stress: Not on file  Social Connections: Not on file  Intimate Partner Violence: Not on file     Review of Systems: General: negative for chills, fever, night sweats or weight changes.  Cardiovascular: negative for chest pain, dyspnea on exertion, edema, orthopnea, palpitations, paroxysmal  nocturnal dyspnea or shortness of breath Dermatological: negative for rash Respiratory: negative for cough or wheezing Urologic: negative for hematuria Abdominal: negative for nausea, vomiting, diarrhea, bright red blood per rectum, melena, or hematemesis Neurologic: negative for visual changes, syncope, or dizziness All other systems reviewed and are otherwise negative except as noted above.    Blood pressure 118/70, pulse (!) 46, height 6\' 1"  (1.854 m), weight 213 lb 12.8 oz (97 kg), SpO2 98 %.  General appearance: alert and no distress Neck: no adenopathy, no carotid bruit, no JVD, supple, symmetrical, trachea midline, and thyroid not enlarged, symmetric, no tenderness/mass/nodules Lungs: clear to auscultation bilaterally Heart: regular rate and rhythm, S1, S2 normal, no murmur, click, rub or gallop Extremities: extremities normal, atraumatic, no cyanosis or edema Pulses: 2+ and symmetric Skin: Skin color, texture, turgor normal. No rashes or lesions Neurologic: Grossly normal  EKG sinus bradycardia 46 with LVH voltage probably normal for age.  I personally reviewed this EKG.  ASSESSMENT AND PLAN:   Bradycardia Zeeshan Korte was referred for bradycardia.  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  Is never had syncope or presyncope.  I do not think that this is problematic but is probably physiologic given the fact that he is an athlete.  I am going to get  a 1 week Zio patch and a 2D echo to further evaluate.     Runell Gess MD FACP,FACC,FAHA, [facility] 06/15/2022 1:12 PM

## 2022-06-16 ENCOUNTER — Ambulatory Visit: Payer: BC Managed Care – PPO | Attending: Cardiovascular Disease

## 2022-06-16 ENCOUNTER — Encounter: Payer: Self-pay | Admitting: Diagnostic Neuroimaging

## 2022-06-16 DIAGNOSIS — R001 Bradycardia, unspecified: Secondary | ICD-10-CM

## 2022-06-16 NOTE — Progress Notes (Unsigned)
Enrolled for [employer] to mail a ZIO XT long term holter monitor to the patients address on file.  

## 2022-06-25 ENCOUNTER — Telehealth: Payer: Self-pay | Admitting: Cardiovascular Disease

## 2022-06-25 NOTE — Telephone Encounter (Signed)
New message:    Patient's Mother called. She said patient was jogging and his monitor fell off. He said he needs another monitor mailed to him please.

## 2022-06-29 ENCOUNTER — Ambulatory Visit (HOSPITAL_COMMUNITY): Payer: BC Managed Care – PPO | Attending: Cardiovascular Disease

## 2022-06-29 DIAGNOSIS — I361 Nonrheumatic tricuspid (valve) insufficiency: Secondary | ICD-10-CM | POA: Diagnosis not present

## 2022-06-29 DIAGNOSIS — I517 Cardiomegaly: Secondary | ICD-10-CM

## 2022-06-29 DIAGNOSIS — R001 Bradycardia, unspecified: Secondary | ICD-10-CM | POA: Insufficient documentation

## 2022-06-29 LAB — ECHOCARDIOGRAM COMPLETE
Area-P 1/2: 2.62 cm2
S' Lateral: 3.5 cm

## 2022-06-30 NOTE — Telephone Encounter (Addendum)
Monitor was delivered 06/17/22.  If you had worn monitor more than 4 days , just mail monitor back to be processed.  [employer] considers a monitor with 4 or more days of data complete.   If you had worn the monitor for less than 4 days, you would still mail the monitor back to be processed.  Let us know if you wore it less than 4 days, we can have 1 redo free of charge mailed to your home.  They will combine the 2 reports.  The first monitor must be mailed back to [facility].

## 2022-07-02 NOTE — Telephone Encounter (Signed)
Patient's mother called to follow-up on patient's heart monitor falling off. Mother stated she would like a call back on what are their next steps.

## 2022-07-05 ENCOUNTER — Telehealth: Payer: Self-pay

## 2022-07-05 ENCOUNTER — Encounter: Payer: Self-pay | Admitting: *Deleted

## 2022-07-05 NOTE — Telephone Encounter (Signed)
I had left this message with Idrissa 5 days ago. Monitor was deliverd 06/17/2022.  If you had worn monitor more than 4 days, just mail monitor back to be processed.  [employer] considers a monitor with 4 or more days of data complete. If you had worn the monitor for less than 4 days, you would still mail the monitor back to be processed.  Let us know if you wore it less than 4 days.If you wore it less than 4 days we can have 1 additional monitor mailed to your home free of charge.

## 2022-07-05 NOTE — Telephone Encounter (Signed)
Spoke with pt's mom, Tarleashia (ok per DPR) regarding zio monitor. Pt wore it for less than 4 days and mom would like to have another monitor sent. Spoke with [facility] and she advises that pt send monitor back to [facility] and she will have a new monitor sent today. Mom verbalizes understanding and will communicate these instructions with her son.

## 2022-07-05 NOTE — Progress Notes (Signed)
Patient ID: Miguel Austin, male   DOB: 01/25/2001, 21 y.o.   MRN: [id number] [name] at [facility] contacted to please cancel charges on ZIO XT patch [id number].  Patient went jogging and monitor fell off.  Replacement monitor to be mailed to patient.  Patient and his mother have been notified to mail the incomplete monitor back to [facility] to be processed.  Report from the 2 monitors will be combined.

## 2022-07-23 ENCOUNTER — Encounter: Payer: Self-pay | Admitting: *Deleted

## 2022-09-07 ENCOUNTER — Ambulatory Visit: Payer: BC Managed Care – PPO | Admitting: Family Medicine

## 2022-11-23 ENCOUNTER — Ambulatory Visit: Payer: BC Managed Care – PPO | Admitting: Family Medicine

## 2023-02-16 ENCOUNTER — Ambulatory Visit: Payer: BC Managed Care – PPO | Admitting: Family Medicine

## 2023-05-02 ENCOUNTER — Ambulatory Visit: Payer: BC Managed Care – PPO | Admitting: Family Medicine

## 2023-07-13 ENCOUNTER — Ambulatory Visit: Payer: BC Managed Care – PPO | Admitting: Family Medicine

## 2023-08-22 DIAGNOSIS — F4323 Adjustment disorder with mixed anxiety and depressed mood: Secondary | ICD-10-CM | POA: Diagnosis not present

## 2023-08-31 DIAGNOSIS — F4323 Adjustment disorder with mixed anxiety and depressed mood: Secondary | ICD-10-CM | POA: Diagnosis not present

## 2023-09-05 DIAGNOSIS — F4323 Adjustment disorder with mixed anxiety and depressed mood: Secondary | ICD-10-CM | POA: Diagnosis not present

## 2023-09-07 DIAGNOSIS — F4323 Adjustment disorder with mixed anxiety and depressed mood: Secondary | ICD-10-CM | POA: Diagnosis not present

## 2023-09-21 ENCOUNTER — Ambulatory Visit: Payer: BC Managed Care – PPO | Admitting: Family Medicine

## 2023-10-03 DIAGNOSIS — F4323 Adjustment disorder with mixed anxiety and depressed mood: Secondary | ICD-10-CM | POA: Diagnosis not present

## 2023-10-17 DIAGNOSIS — F4323 Adjustment disorder with mixed anxiety and depressed mood: Secondary | ICD-10-CM | POA: Diagnosis not present

## 2023-10-31 DIAGNOSIS — F4323 Adjustment disorder with mixed anxiety and depressed mood: Secondary | ICD-10-CM | POA: Diagnosis not present

## 2023-11-17 DIAGNOSIS — F4323 Adjustment disorder with mixed anxiety and depressed mood: Secondary | ICD-10-CM | POA: Diagnosis not present

## 2023-11-23 DIAGNOSIS — F4323 Adjustment disorder with mixed anxiety and depressed mood: Secondary | ICD-10-CM | POA: Diagnosis not present

## 2023-11-25 ENCOUNTER — Ambulatory Visit: Payer: BC Managed Care – PPO | Admitting: Family Medicine

## 2023-11-30 DIAGNOSIS — F4323 Adjustment disorder with mixed anxiety and depressed mood: Secondary | ICD-10-CM | POA: Diagnosis not present

## 2023-12-07 DIAGNOSIS — F4323 Adjustment disorder with mixed anxiety and depressed mood: Secondary | ICD-10-CM | POA: Diagnosis not present

## 2023-12-14 DIAGNOSIS — F4323 Adjustment disorder with mixed anxiety and depressed mood: Secondary | ICD-10-CM | POA: Diagnosis not present

## 2023-12-28 DIAGNOSIS — F4323 Adjustment disorder with mixed anxiety and depressed mood: Secondary | ICD-10-CM | POA: Diagnosis not present

## 2024-01-18 DIAGNOSIS — F4323 Adjustment disorder with mixed anxiety and depressed mood: Secondary | ICD-10-CM | POA: Diagnosis not present

## 2024-01-25 DIAGNOSIS — F4323 Adjustment disorder with mixed anxiety and depressed mood: Secondary | ICD-10-CM | POA: Diagnosis not present

## 2024-02-01 ENCOUNTER — Ambulatory Visit: Payer: BC Managed Care – PPO | Admitting: Family Medicine

## 2024-02-06 DIAGNOSIS — F4323 Adjustment disorder with mixed anxiety and depressed mood: Secondary | ICD-10-CM | POA: Diagnosis not present

## 2024-02-17 DIAGNOSIS — M7041 Prepatellar bursitis, right knee: Secondary | ICD-10-CM | POA: Diagnosis not present

## 2024-02-17 DIAGNOSIS — M25561 Pain in right knee: Secondary | ICD-10-CM | POA: Diagnosis not present

## 2024-02-20 DIAGNOSIS — M7041 Prepatellar bursitis, right knee: Secondary | ICD-10-CM | POA: Diagnosis not present

## 2024-02-27 DIAGNOSIS — M25561 Pain in right knee: Secondary | ICD-10-CM | POA: Diagnosis not present

## 2024-02-27 DIAGNOSIS — M7041 Prepatellar bursitis, right knee: Secondary | ICD-10-CM | POA: Diagnosis not present

## 2024-02-28 DIAGNOSIS — F4323 Adjustment disorder with mixed anxiety and depressed mood: Secondary | ICD-10-CM | POA: Diagnosis not present

## 2024-03-20 DIAGNOSIS — F4323 Adjustment disorder with mixed anxiety and depressed mood: Secondary | ICD-10-CM | POA: Diagnosis not present

## 2024-04-18 DIAGNOSIS — F4323 Adjustment disorder with mixed anxiety and depressed mood: Secondary | ICD-10-CM | POA: Diagnosis not present

## 2024-04-23 ENCOUNTER — Ambulatory Visit: Admitting: Family Medicine

## 2024-06-26 ENCOUNTER — Ambulatory Visit: Admitting: Family Medicine

## 2024-06-26 DIAGNOSIS — F4323 Adjustment disorder with mixed anxiety and depressed mood: Secondary | ICD-10-CM | POA: Diagnosis not present

## 2024-08-09 ENCOUNTER — Ambulatory Visit: Admitting: Family Medicine

## 2024-08-21 DIAGNOSIS — F4323 Adjustment disorder with mixed anxiety and depressed mood: Secondary | ICD-10-CM | POA: Diagnosis not present

## 2024-08-29 DIAGNOSIS — F4323 Adjustment disorder with mixed anxiety and depressed mood: Secondary | ICD-10-CM | POA: Diagnosis not present

## 2024-09-05 DIAGNOSIS — F4323 Adjustment disorder with mixed anxiety and depressed mood: Secondary | ICD-10-CM | POA: Diagnosis not present

## 2024-09-10 DIAGNOSIS — F4323 Adjustment disorder with mixed anxiety and depressed mood: Secondary | ICD-10-CM | POA: Diagnosis not present

## 2024-09-27 DIAGNOSIS — F4323 Adjustment disorder with mixed anxiety and depressed mood: Secondary | ICD-10-CM | POA: Diagnosis not present

## 2024-10-08 ENCOUNTER — Ambulatory Visit: Admitting: Family Medicine

## 2024-11-29 ENCOUNTER — Ambulatory Visit: Admitting: Family Medicine
